# Patient Record
Sex: Female | Born: 1975 | Hispanic: Yes | Marital: Married | Smoking: Never smoker
Health system: Southern US, Community
[De-identification: ages and names within clinical notes are randomized; demographics above are authoritative.]

---

## 2007-02-20 ENCOUNTER — Encounter: Payer: Self-pay | Admitting: Maternal & Fetal Medicine

## 2007-07-11 ENCOUNTER — Ambulatory Visit: Payer: Self-pay | Admitting: Obstetrics and Gynecology

## 2007-07-14 ENCOUNTER — Inpatient Hospital Stay: Payer: Self-pay | Admitting: Obstetrics and Gynecology

## 2011-07-31 ENCOUNTER — Ambulatory Visit: Payer: Self-pay

## 2011-08-07 ENCOUNTER — Ambulatory Visit: Payer: Self-pay

## 2012-01-30 HISTORY — PX: BREAST BIOPSY: SHX20

## 2012-02-14 ENCOUNTER — Ambulatory Visit: Payer: Self-pay | Admitting: Surgery

## 2012-03-03 ENCOUNTER — Ambulatory Visit: Payer: Self-pay | Admitting: Surgery

## 2012-03-04 LAB — PATHOLOGY REPORT

## 2012-08-08 ENCOUNTER — Ambulatory Visit: Payer: Self-pay

## 2013-11-19 ENCOUNTER — Ambulatory Visit: Payer: Self-pay

## 2014-10-22 ENCOUNTER — Other Ambulatory Visit: Payer: Self-pay | Admitting: Obstetrics and Gynecology

## 2014-10-22 DIAGNOSIS — Z1231 Encounter for screening mammogram for malignant neoplasm of breast: Secondary | ICD-10-CM

## 2014-11-22 ENCOUNTER — Ambulatory Visit
Admission: RE | Admit: 2014-11-22 | Discharge: 2014-11-22 | Disposition: A | Discharge status: Home / Self Care | Payer: 59 | Source: Ambulatory Visit | Attending: Obstetrics and Gynecology | Admitting: Obstetrics and Gynecology

## 2014-11-22 DIAGNOSIS — Z1231 Encounter for screening mammogram for malignant neoplasm of breast: Secondary | ICD-10-CM | POA: Diagnosis present

## 2016-01-25 ENCOUNTER — Other Ambulatory Visit: Payer: Self-pay | Admitting: Obstetrics and Gynecology

## 2016-01-25 DIAGNOSIS — Z1231 Encounter for screening mammogram for malignant neoplasm of breast: Secondary | ICD-10-CM

## 2016-02-24 ENCOUNTER — Ambulatory Visit
Admission: RE | Admit: 2016-02-24 | Discharge: 2016-02-24 | Disposition: A | Discharge status: Home / Self Care | Payer: BLUE CROSS/BLUE SHIELD | Source: Ambulatory Visit | Attending: Obstetrics and Gynecology | Admitting: Obstetrics and Gynecology

## 2016-02-24 DIAGNOSIS — Z1231 Encounter for screening mammogram for malignant neoplasm of breast: Secondary | ICD-10-CM | POA: Diagnosis not present

## 2017-02-27 DIAGNOSIS — E611 Iron deficiency: Secondary | ICD-10-CM | POA: Insufficient documentation

## 2017-02-27 DIAGNOSIS — E559 Vitamin D deficiency, unspecified: Secondary | ICD-10-CM | POA: Insufficient documentation

## 2017-05-06 ENCOUNTER — Ambulatory Visit
Admission: RE | Admit: 2017-05-06 | Discharge: 2017-05-06 | Disposition: A | Discharge status: Home / Self Care | Payer: Self-pay | Source: Ambulatory Visit | Attending: Oncology | Admitting: Oncology

## 2017-05-06 ENCOUNTER — Encounter (INDEPENDENT_AMBULATORY_CARE_PROVIDER_SITE_OTHER): Payer: Self-pay

## 2017-05-06 ENCOUNTER — Ambulatory Visit: Payer: Self-pay | Attending: Oncology | Admitting: *Deleted

## 2017-05-06 VITALS — BP 104/71 | HR 72 | Temp 98.8°F | Ht 62.0 in | Wt 142.0 lb

## 2017-05-06 DIAGNOSIS — Z0000 Encounter for general adult medical examination without abnormal findings: Secondary | ICD-10-CM

## 2017-05-06 NOTE — Progress Notes (Signed)
Subjective:     Patient ID: Timmothy Soursvonne Clauss, female   DOB: 04-20-1975, 42 y.o.   MRN: [id number]  HPI   Review of Systems     Objective:   Physical Exam  Pulmonary/Chest: Right breast exhibits no inverted nipple, no mass, no nipple discharge, no skin change and no tenderness. Left breast exhibits no inverted nipple, no mass, no nipple discharge, no skin change and no tenderness. Breasts are symmetrical.         Assessment:     42 year old English speaking Hispanic [facility].  Last pap was completed on  01/25/16 per notes in Epic and was normal.  Next pap due in 2020.  Clinical breast exam difficult due to scattered fibroglandular like tissue.  There is no dominant mass, skin changes, nipple discharge or lymphadenopathy.  Taught self breast awareness. Patient has been screened for eligibility.  She does not have any insurance, Medicare or Medicaid.  She also meets financial eligibility.  Hand-out given on the Affordable Care Act.    Plan:     Screening mammogram ordered.  Will follow-up per [facility] protocol.

## 2017-05-06 NOTE — Patient Instructions (Signed)
Gave patient hand-out, Women Staying Healthy, Active and Well from [facility], with education on breast health, pap smears, heart and colon health. 

## 2017-05-08 ENCOUNTER — Encounter: Payer: Self-pay | Admitting: *Deleted

## 2017-05-08 NOTE — Progress Notes (Signed)
Letter mailed from the [facility] to inform patient of her normal mammogram results.  Patient is to follow-up with annual screening in one year.  HSIS to Christy. 

## 2018-06-16 ENCOUNTER — Ambulatory Visit: Payer: Self-pay

## 2018-07-31 ENCOUNTER — Telehealth: Payer: Self-pay | Admitting: *Deleted

## 2018-07-31 NOTE — Telephone Encounter (Signed)
Referred for covid19 testing by [place] health department due to possible exposure on 6/23 (last contact with that person). Denies all symptoms at this time. Discussed dates and discontinuing quarantine. Remaining free of symptoms for these last 3 days/no tylenol/advil to mask fever. Continue to monitor for symptoms for now. Afterwards, wear mask frequent handwashing and social distancing.

## 2018-08-05 ENCOUNTER — Other Ambulatory Visit: Payer: Self-pay

## 2018-08-05 DIAGNOSIS — Z20822 Contact with and (suspected) exposure to covid-19: Secondary | ICD-10-CM

## 2018-08-10 LAB — NOVEL CORONAVIRUS, NAA: SARS-CoV-2, NAA: NOT DETECTED

## 2019-03-11 ENCOUNTER — Encounter: Payer: Self-pay | Admitting: *Deleted

## 2019-03-11 ENCOUNTER — Ambulatory Visit
Admission: RE | Admit: 2019-03-11 | Discharge: 2019-03-11 | Disposition: A | Discharge status: Home / Self Care | Payer: Self-pay | Source: Ambulatory Visit | Attending: Oncology | Admitting: Oncology

## 2019-03-11 ENCOUNTER — Ambulatory Visit: Payer: Self-pay | Attending: Oncology | Admitting: *Deleted

## 2019-03-11 ENCOUNTER — Other Ambulatory Visit: Payer: Self-pay

## 2019-03-11 VITALS — BP 123/74 | HR 85 | Temp 97.5°F | Resp 18 | Ht 63.0 in | Wt 150.0 lb

## 2019-03-11 DIAGNOSIS — Z0000 Encounter for general adult medical examination without abnormal findings: Secondary | ICD-10-CM

## 2019-03-11 NOTE — Progress Notes (Signed)
  Subjective:     Patient ID: Tanya Buchanan, female   DOB: [birth year], 44 y.o.   MRN: [id number]  HPI   Review of Systems     Objective:   Physical Exam Chest:     Breasts:        Right: No swelling, bleeding, inverted nipple, mass, nipple discharge, skin change or tenderness.        Left: No swelling, bleeding, inverted nipple, mass, nipple discharge, skin change or tenderness.    Lymphadenopathy:     Upper Body:     Right upper body: No supraclavicular or axillary adenopathy.     Left upper body: No supraclavicular or axillary adenopathy.        Assessment:     44 year old English speaking Hispanic female returns to [facility] for annual screening.  Clinical breast exam unremarkable.  Taught self breast awareness.  Last pap at [facility] in January, 2021.  Next pap due per [facility] guidelines.  Patient has been screened for eligibility.  She does not have any insurance, Medicare or Medicaid.  She also meets financial eligibility.   Risk Assessment    Risk Scores      03/11/2019   Last edited by: Lynden Oxford, NT   5-year risk: 0.8 %   Lifetime risk: 8.4 %            Plan:     Screening mammogram ordered.  Will follow up per [facility] guidelines.

## 2019-03-20 ENCOUNTER — Encounter: Payer: Self-pay | Admitting: *Deleted

## 2019-03-20 NOTE — Progress Notes (Signed)
Letter mailed from [facility] to inform patient of her normal mammogram results.  Patient is to follow-up with annual screening in one year. 

## 2020-03-15 ENCOUNTER — Ambulatory Visit
Admission: RE | Admit: 2020-03-15 | Discharge: 2020-03-15 | Disposition: A | Discharge status: Home / Self Care | Payer: Self-pay | Source: Ambulatory Visit | Attending: Oncology | Admitting: Oncology

## 2020-03-15 ENCOUNTER — Encounter: Payer: Self-pay | Admitting: *Deleted

## 2020-03-15 ENCOUNTER — Ambulatory Visit: Payer: Self-pay | Attending: Oncology | Admitting: *Deleted

## 2020-03-15 ENCOUNTER — Other Ambulatory Visit: Payer: Self-pay

## 2020-03-15 VITALS — BP 111/68 | HR 73 | Temp 96.9°F | Ht 62.0 in | Wt 137.0 lb

## 2020-03-15 DIAGNOSIS — Z0000 Encounter for general adult medical examination without abnormal findings: Secondary | ICD-10-CM | POA: Insufficient documentation

## 2020-03-15 NOTE — Patient Instructions (Signed)
Gave patient hand-out, Women Staying Healthy, Active and Well from [facility], with education on breast health, pap smears, heart and colon health. 

## 2020-03-15 NOTE — Progress Notes (Signed)
  Subjective:     Patient ID: Tanya Buchanan, female   DOB: Jan 05, 1976, 45 y.o.   MRN: [id number]  HPI   [facility] Medical History Record - 03/15/20 1191      Breast History   Screening cycle New    Provider (CBE) June Leap    Initial Mammogram 03/15/20    Last Mammogram Annual    Last Mammogram Date 03/11/19    Provider (Mammogram)  Delford Field    Recent Breast Symptoms None      Breast Cancer History   Breast Cancer History No personal or family history      Previous History of Breast Problems   Breast Surgery or Biopsy Right   2014 benign; Dr. Katrinka Blazing   Breast Implants N/A    BSE Done Monthly      Gynecological/Obstetrical History   LMP 03/15/20    Is there any chance that the client could be pregnant?  No    Age at menarche 16    Age at menopause n/a    PAP smear history Annually    Date of last PAP  02/11/19    Provider (PAP) Gavin Potters    Age at first live birth 56    Breast fed children Yes (type length in comments)   6 months   DES Exposure No    Cervical, Uterine or Ovarian cancer No    Family history of Cervial, Uterine or Ovarian cancer No    Hysterectomy No    Cervix removed No    Ovaries removed No    Laser/Cryosurgery No    Current method of birth control None    Current method of Estrogen/Hormone replacement None    Smoking history None            Review of Systems     Objective:   Physical Exam Chest:  Breasts:     Right: No swelling, bleeding, inverted nipple, mass, nipple discharge, skin change, tenderness, axillary adenopathy or supraclavicular adenopathy.     Left: No swelling, bleeding, inverted nipple, mass, nipple discharge, skin change, tenderness, axillary adenopathy or supraclavicular adenopathy.     Lymphadenopathy:     Upper Body:     Right upper body: No supraclavicular or axillary adenopathy.     Left upper body: No supraclavicular or axillary adenopathy.        Assessment:     45 year old Hispanic female returns to  [facility] for annual screening.  Anne Ng, the interpreter present during the interview and exam.  Taught self breast awareness.  Last pap was on 1.13.21 and was negative / negative.  Next pap due in 4 years.  Patient has been screened for eligibility.  She does not have any insurance, Medicare or Medicaid.  She also meets financial eligibility.   Risk Assessment    Risk Scores      03/15/2020 03/11/2019   Last edited by: Scarlett Presto, RN Starcher, Burgess Amor, NT   5-year risk: 0.9 % 0.8 %   Lifetime risk: 8.3 % 8.4 %            Plan:     Screening mammogram ordered.  Will follow up per [facility] protocol.

## 2020-03-18 ENCOUNTER — Encounter: Payer: Self-pay | Admitting: *Deleted

## 2020-03-18 NOTE — Progress Notes (Signed)
Letter mailed from [facility] to inform patient of her normal mammogram results.  Patient is to follow-up with annual screening in one year. 

## 2020-09-13 DIAGNOSIS — E221 Hyperprolactinemia: Secondary | ICD-10-CM | POA: Insufficient documentation

## 2021-03-21 ENCOUNTER — Other Ambulatory Visit: Payer: Self-pay | Admitting: Internal Medicine

## 2021-03-21 DIAGNOSIS — E221 Hyperprolactinemia: Secondary | ICD-10-CM

## 2021-04-05 ENCOUNTER — Other Ambulatory Visit: Payer: Self-pay

## 2021-04-05 DIAGNOSIS — Z1231 Encounter for screening mammogram for malignant neoplasm of breast: Secondary | ICD-10-CM

## 2021-05-02 ENCOUNTER — Ambulatory Visit: Payer: Self-pay

## 2021-05-03 ENCOUNTER — Other Ambulatory Visit: Payer: Self-pay

## 2021-05-03 DIAGNOSIS — Z1211 Encounter for screening for malignant neoplasm of colon: Secondary | ICD-10-CM

## 2021-05-09 ENCOUNTER — Ambulatory Visit
Admission: RE | Admit: 2021-05-09 | Discharge: 2021-05-09 | Disposition: A | Discharge status: Home / Self Care | Payer: Self-pay | Source: Ambulatory Visit | Attending: Obstetrics and Gynecology | Admitting: Obstetrics and Gynecology

## 2021-05-09 ENCOUNTER — Ambulatory Visit: Payer: Self-pay | Attending: Hematology and Oncology | Admitting: *Deleted

## 2021-05-09 VITALS — BP 115/69 | HR 66 | Resp 18 | Wt 138.0 lb

## 2021-05-09 DIAGNOSIS — Z1231 Encounter for screening mammogram for malignant neoplasm of breast: Secondary | ICD-10-CM | POA: Insufficient documentation

## 2021-05-09 DIAGNOSIS — Z1239 Encounter for other screening for malignant neoplasm of breast: Secondary | ICD-10-CM

## 2021-05-09 NOTE — Patient Instructions (Signed)
Explained breast self awareness with [name]. Patient did not need a Pap smear today due to last Pap smear and HPV typing was 02/11/2019. Let her know [facility] will cover Pap smears and HPV typing every 5 years unless has a history of abnormal Pap smears. Referred patient to the [facility] for a screening mammogram. Appointment scheduled Tuesday, May 09, 2021 at 1040. Patient aware of appointment and will be there. Let patient know Delford Field will follow up with her within the next couple weeks with results of mammogram by letter or phone. [name] verbalized understanding. ? ?Clark Cuff, Kathaleen Maser, RN ?9:43 AM ? ? ? ? ?

## 2021-05-09 NOTE — Progress Notes (Signed)
[name] is a 46 y.o. female who presents to [facility] clinic today with no complaints.  ?  ?Pap Smear: Pap smear not completed today. Last Pap smear was 02/11/2019 at [facility] clinic and was normal with negative HPV. Per patient has no history of an abnormal Pap smear. Last Pap smear result is available in Epic. ?  ?Physical exam: ?Breasts ?Breasts symmetrical. No skin abnormalities bilateral breasts. No nipple retraction bilateral breasts. No nipple discharge bilateral breasts. No lymphadenopathy. No lumps palpated bilateral breasts. No complaints of pain or tenderness on exam. ? ?MS DIGITAL SCREENING TOMO BILATERAL ? ?Result Date: 03/18/2020 ?CLINICAL DATA:  Screening. EXAM: DIGITAL SCREENING BILATERAL MAMMOGRAM WITH TOMOSYNTHESIS AND CAD TECHNIQUE: Bilateral screening digital craniocaudal and mediolateral oblique mammograms were obtained. Bilateral screening digital breast tomosynthesis was performed. The images were evaluated with computer-aided detection. COMPARISON:  Previous exam(s). ACR Breast Density Category c: The breast tissue is heterogeneously dense, which may obscure small masses. FINDINGS: There are no findings suspicious for malignancy. IMPRESSION: No mammographic evidence of malignancy. A result letter of this screening mammogram will be mailed directly to the patient. RECOMMENDATION: Screening mammogram in one year. (Code:SM-B-01Y) BI-RADS CATEGORY  1: Negative. Electronically Signed   By: Tanya Buchanan M.D.   On: 03/18/2020 08:04  ? ?MS DIGITAL SCREENING TOMO BILATERAL ? ?Result Date: 03/12/2019 ?CLINICAL DATA:  Screening. EXAM: DIGITAL SCREENING BILATERAL MAMMOGRAM WITH TOMO AND CAD COMPARISON:  Previous exam(s). ACR Breast Density Category c: The breast tissue is heterogeneously dense, which may obscure small masses. FINDINGS: There are no findings suspicious for malignancy. Images were processed with CAD. IMPRESSION: No mammographic evidence of malignancy. A result letter of  this screening mammogram will be mailed directly to the patient. RECOMMENDATION: Screening mammogram in one year. (Code:SM-B-01Y) BI-RADS CATEGORY  1: Negative. Electronically Signed   By: Tanya Buchanan M.D.   On: 03/12/2019 09:46  ? ?MS DIGITAL SCREENING TOMO BILATERAL ? ?Result Date: 05/06/2017 ?CLINICAL DATA:  Screening. EXAM: DIGITAL SCREENING BILATERAL MAMMOGRAM WITH TOMO AND CAD COMPARISON:  Previous exam(s). ACR Breast Density Category c: The breast tissue is heterogeneously dense, which may obscure small masses. FINDINGS: There are no findings suspicious for malignancy. Images were processed with CAD. IMPRESSION: No mammographic evidence of malignancy. A result letter of this screening mammogram will be mailed directly to the patient. RECOMMENDATION: Screening mammogram in one year. (Code:SM-B-01Y) BI-RADS CATEGORY  1: Negative. Electronically Signed   By: Tanya Buchanan M.D.   On: 05/06/2017 14:02   ?     ?Pelvic/Bimanual ?Pap is not indicated today per [facility] guidelines. ?  ?Smoking History: ?Patient has never smoked. ?  ?Patient Navigation: ?Patient education provided. Access to services provided for patient through [employer] program. Spanish interpreter Tanya Buchanan from [facility] provided.  ? ?Colorectal Cancer Screening: ?Per patient has never had colonoscopy completed. FIT Test given to patient to complete. No complaints today.  ?  ?Breast and Cervical Cancer Risk Assessment: ?Patient does not have family history of breast cancer, known genetic mutations, or radiation treatment to the chest before age 83. Patient does not have history of cervical dysplasia, immunocompromised, or DES exposure in-utero. ? ?Risk Assessment   ? ? Risk Scores   ? ?   05/09/2021 03/15/2020  ? Last edited by: Tanya Chris, RN Tanya Presto, RN  ? 5-year risk: 0.9 % 0.9 %  ? Lifetime risk: 8.2 % 8.3 %  ? ?  ?  ? ?  ? ? ?A: ?[facility] exam without  pap smear ?No complaints. ? ?P: ?Referred patient to the [facility] for a screening mammogram. Appointment scheduled Tuesday, May 09, 2021 at 1040. ? ?Tanya Heidelberg, RN ?05/09/2021 9:43 AM   ?

## 2021-05-10 ENCOUNTER — Other Ambulatory Visit: Payer: Self-pay | Admitting: Obstetrics and Gynecology

## 2021-05-18 LAB — FECAL OCCULT BLOOD, IMMUNOCHEMICAL: Fecal Occult Bld: NEGATIVE

## 2022-05-22 ENCOUNTER — Other Ambulatory Visit: Payer: Self-pay

## 2022-05-22 DIAGNOSIS — Z1231 Encounter for screening mammogram for malignant neoplasm of breast: Secondary | ICD-10-CM

## 2022-06-11 ENCOUNTER — Ambulatory Visit
Admission: RE | Admit: 2022-06-11 | Discharge: 2022-06-11 | Disposition: A | Discharge status: Home / Self Care | Payer: Self-pay | Source: Ambulatory Visit | Attending: Obstetrics and Gynecology | Admitting: Obstetrics and Gynecology

## 2022-06-11 ENCOUNTER — Ambulatory Visit: Payer: Self-pay | Attending: Hematology and Oncology | Admitting: Hematology and Oncology

## 2022-06-11 VITALS — Wt 138.8 lb

## 2022-06-11 DIAGNOSIS — Z1231 Encounter for screening mammogram for malignant neoplasm of breast: Secondary | ICD-10-CM

## 2022-06-11 NOTE — Progress Notes (Signed)
[name] is a 47 y.o. female who presents to [facility] clinic today with no complaints.    Pap Smear: Pap not smear completed today. Last Pap smear was 02/11/2019 at [facility] clinic and was normal. Per patient has no history of an abnormal Pap smear. Last Pap smear result is available in Epic.   Physical exam: Breasts Breasts symmetrical. No skin abnormalities bilateral breasts. No nipple retraction bilateral breasts. No nipple discharge bilateral breasts. No lymphadenopathy. No lumps palpated bilateral breasts.  MS DIGITAL SCREENING TOMO BILATERAL  Result Date: 05/09/2021 CLINICAL DATA:  Screening. EXAM: DIGITAL SCREENING BILATERAL MAMMOGRAM WITH TOMOSYNTHESIS AND CAD TECHNIQUE: Bilateral screening digital craniocaudal and mediolateral oblique mammograms were obtained. Bilateral screening digital breast tomosynthesis was performed. The images were evaluated with computer-aided detection. COMPARISON:  Previous exam(s). ACR Breast Density Category c: The breast tissue is heterogeneously dense, which may obscure small masses. FINDINGS: There are no findings suspicious for malignancy. IMPRESSION: No mammographic evidence of malignancy. A result letter of this screening mammogram will be mailed directly to the patient. RECOMMENDATION: Screening mammogram in one year. (Code:SM-B-01Y) BI-RADS CATEGORY  1: Negative. Electronically Signed   By: Ted Mcalpine M.D.   On: 05/09/2021 17:22   MS DIGITAL SCREENING TOMO BILATERAL  Result Date: 03/18/2020 CLINICAL DATA:  Screening. EXAM: DIGITAL SCREENING BILATERAL MAMMOGRAM WITH TOMOSYNTHESIS AND CAD TECHNIQUE: Bilateral screening digital craniocaudal and mediolateral oblique mammograms were obtained. Bilateral screening digital breast tomosynthesis was performed. The images were evaluated with computer-aided detection. COMPARISON:  Previous exam(s). ACR Breast Density Category c: The breast tissue is heterogeneously dense, which may obscure small masses.  FINDINGS: There are no findings suspicious for malignancy. IMPRESSION: No mammographic evidence of malignancy. A result letter of this screening mammogram will be mailed directly to the patient. RECOMMENDATION: Screening mammogram in one year. (Code:SM-B-01Y) BI-RADS CATEGORY  1: Negative. Electronically Signed   By: Emmaline Kluver M.D.   On: 03/18/2020 08:04   MS DIGITAL SCREENING TOMO BILATERAL  Result Date: 03/12/2019 CLINICAL DATA:  Screening. EXAM: DIGITAL SCREENING BILATERAL MAMMOGRAM WITH TOMO AND CAD COMPARISON:  Previous exam(s). ACR Breast Density Category c: The breast tissue is heterogeneously dense, which may obscure small masses. FINDINGS: There are no findings suspicious for malignancy. Images were processed with CAD. IMPRESSION: No mammographic evidence of malignancy. A result letter of this screening mammogram will be mailed directly to the patient. RECOMMENDATION: Screening mammogram in one year. (Code:SM-B-01Y) BI-RADS CATEGORY  1: Negative. Electronically Signed   By: Frederico Hamman M.D.   On: 03/12/2019 09:46         Pelvic/Bimanual Pap is not indicated today    Smoking History: Patient has never smoked and was not referred to quit line.    Patient Navigation: Patient education provided. Access to services provided for patient through [facility] program. No interpreter provided. No transportation provided   Colorectal Cancer Screening: Per patient has never had colonoscopy completed No complaints today. FIT test completed 05/14/2022   Breast and Cervical Cancer Risk Assessment: Patient does not have family history of breast cancer, known genetic mutations, or radiation treatment to the chest before age 66. Patient does not have history of cervical dysplasia, immunocompromised, or DES exposure in-utero.  Risk Scores as of 06/11/2022     Tanya Buchanan           5-year 1.25 %   Lifetime 11.25 %   This patient is Hisp[place]/Latina but has no documented birth country, so the [employer]  model used data from [place] patients to  calculate their risk score. Document a birth country in the Demographics activity for a more accurate score.         Last calculated by Caprice Red, CMA on 06/11/2022 at  1:39 PM        A: [facility] exam without pap smear No complaints with benign exam.   P: [facility]. Appointment scheduled 06/11/22.  Pascal Lux, NP 06/11/2022 1:55 PM

## 2022-06-11 NOTE — Patient Instructions (Signed)
Taught Tanya Buchanan about self breast awareness and gave educational materials to take home. Patient did not need a Pap smear today due to last Pap smear was in 02/11/2019 per patient. Let her know [facility] will cover Pap smears every 5 years unless has a history of abnormal Pap smears. [facility]. Appointment scheduled for 06/11/22. Patient aware of appointment and will be there. Let patient know will follow up with her within the next couple weeks with results. Tanya Buchanan verbalized understanding.  Pascal Lux, NP 1:57 PM

## 2023-01-19 IMAGING — MG MM DIGITAL SCREENING BILAT W/ TOMO AND CAD
8 series · 9 of 24 positions shown · non-contrast
Comparison: Previous exam(s).

CLINICAL DATA: Screening.

EXAM:
DIGITAL SCREENING BILATERAL MAMMOGRAM WITH TOMOSYNTHESIS AND CAD
TECHNIQUE: Bilateral screening digital craniocaudal and mediolateral oblique
mammograms were obtained. Bilateral screening digital breast
tomosynthesis was performed. The images were evaluated with
computer-aided detection.

[L CC synth-2D]
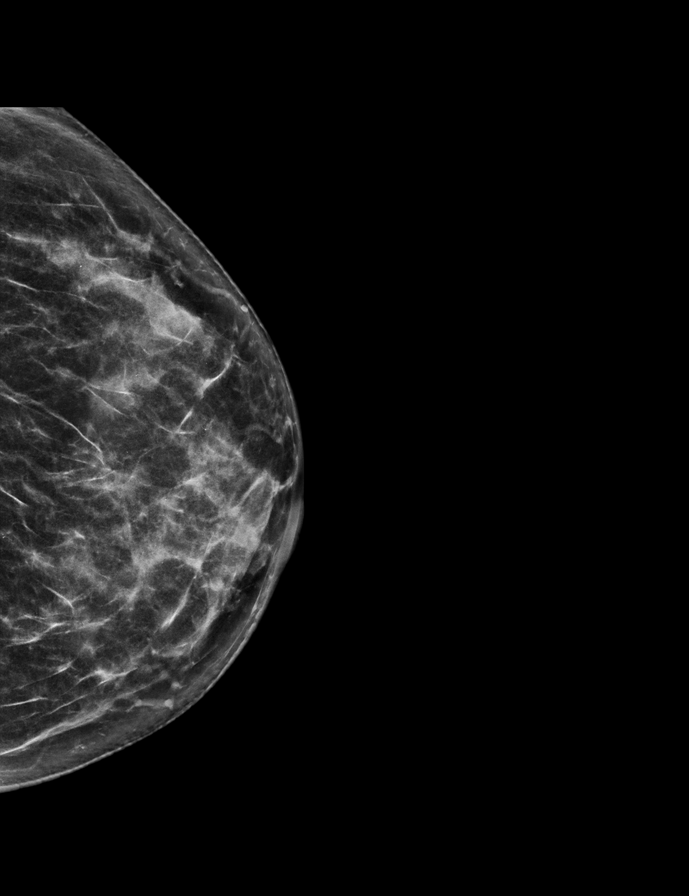

[L MLO synth-2D]
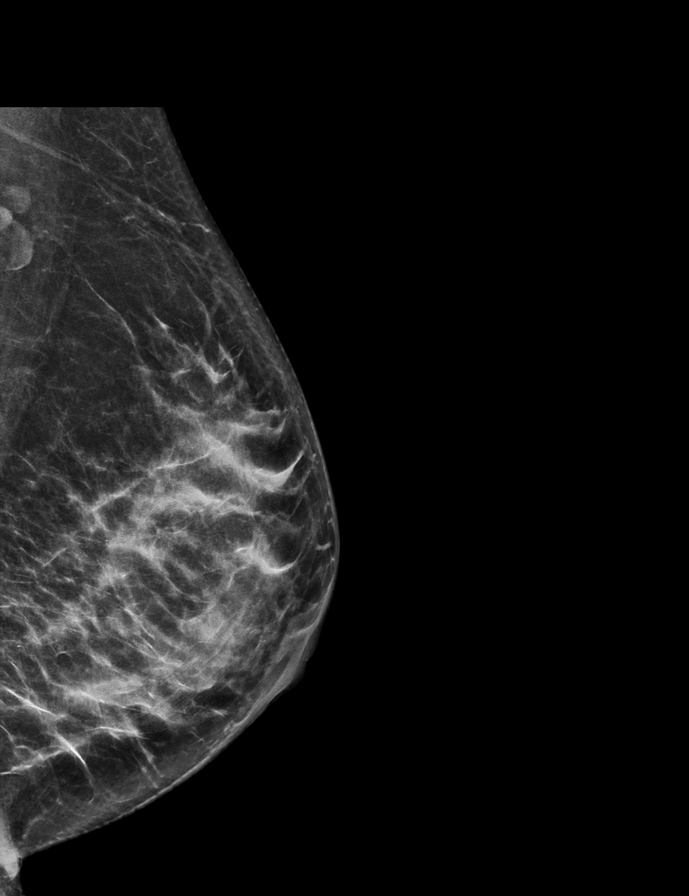

[R CC synth-2D]
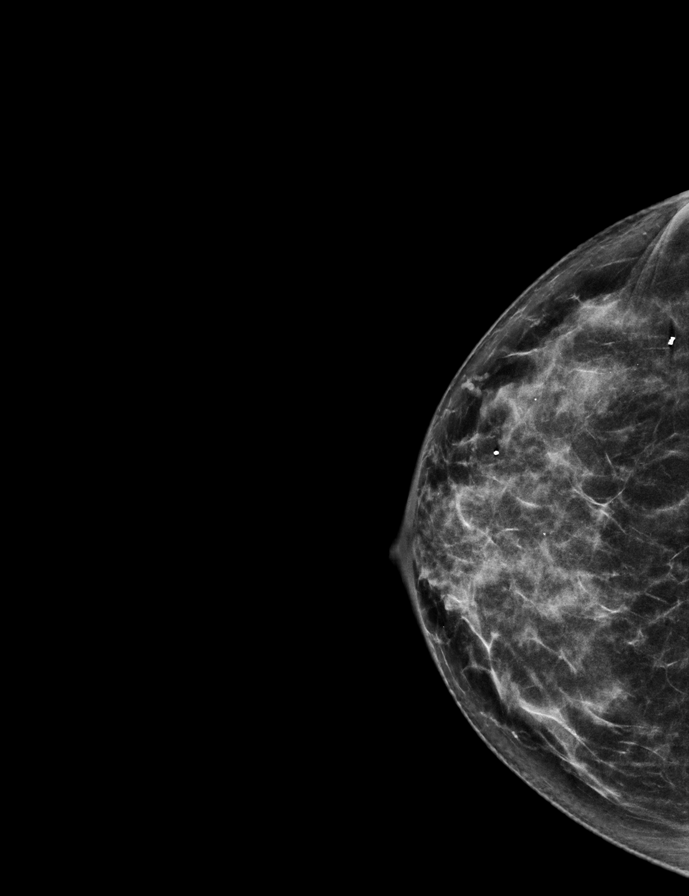

[R MLO synth-2D]
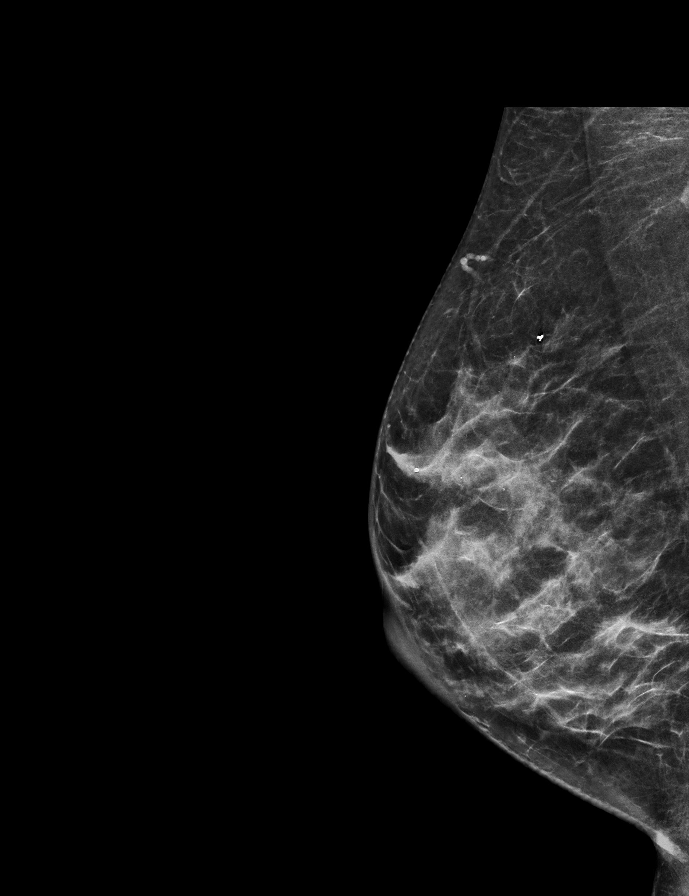

[L MLO tomo · 2 of 63 frames shown]
[frame 21/63]
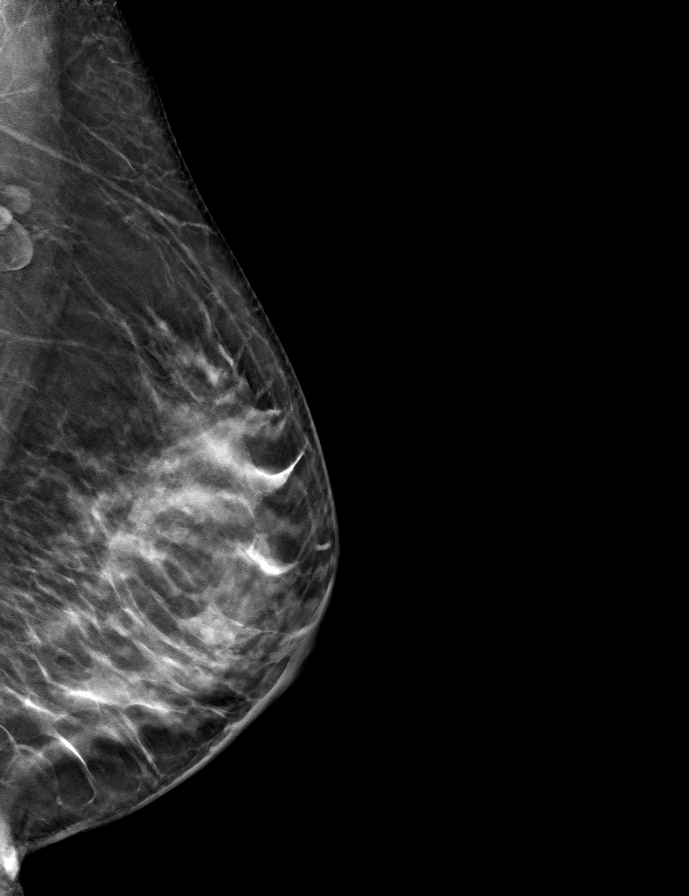
[frame 32/63]
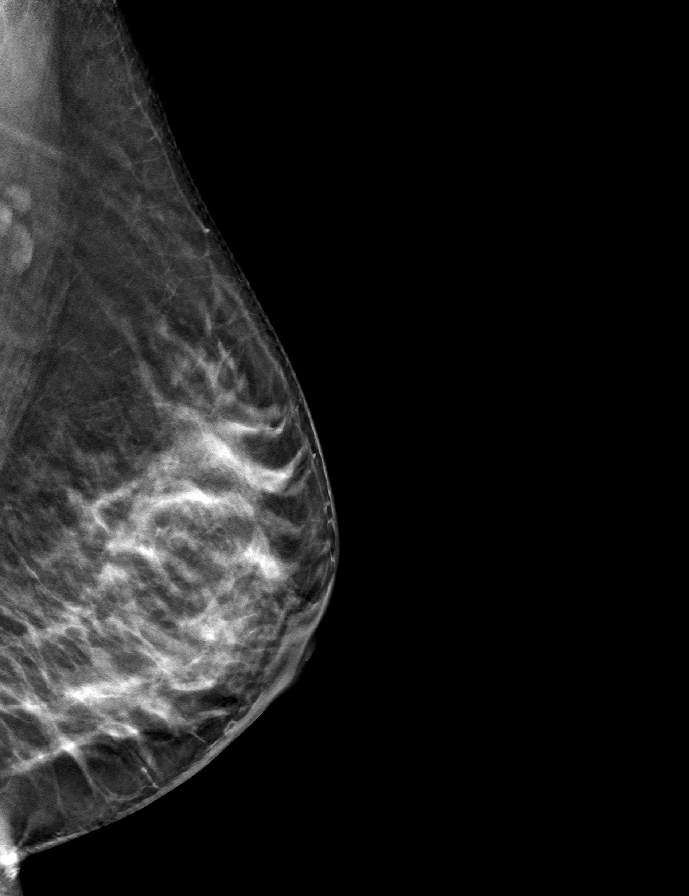

[R CC tomo · tomo slice 31/60.0]
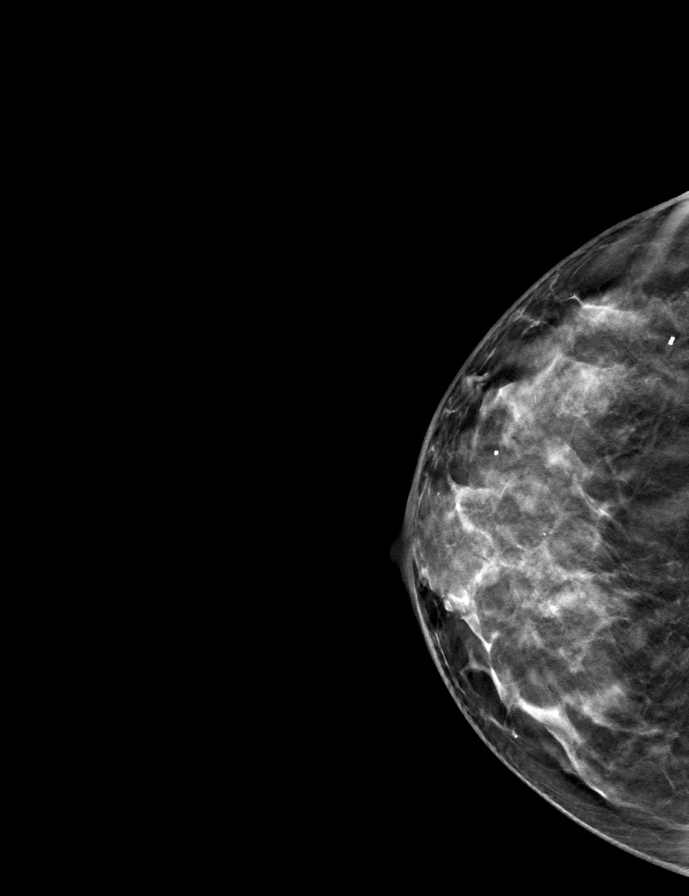

[R MLO tomo · tomo slice 31/60.0]
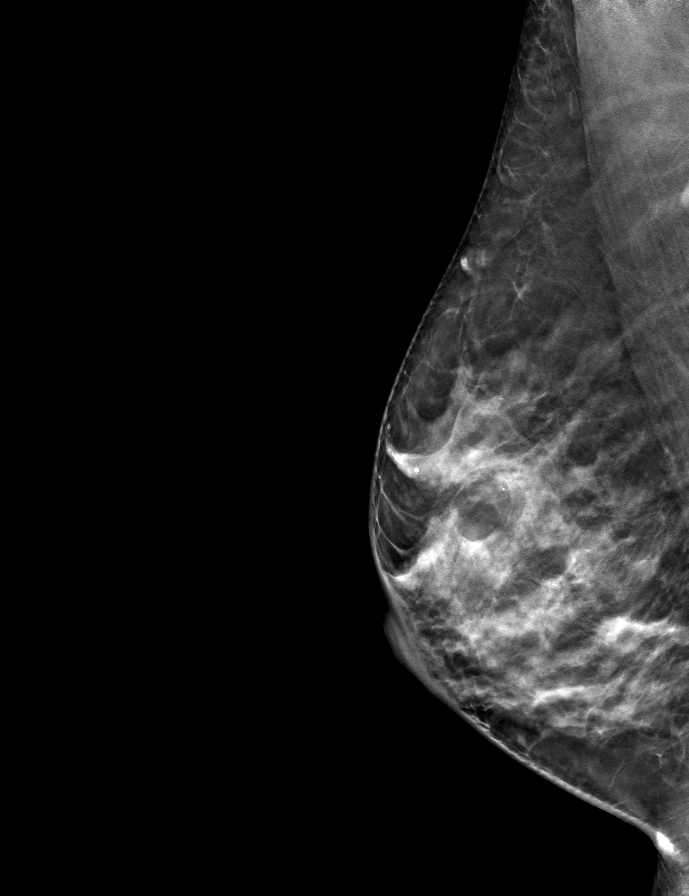

[L CC tomo · tomo slice 31/61.0]
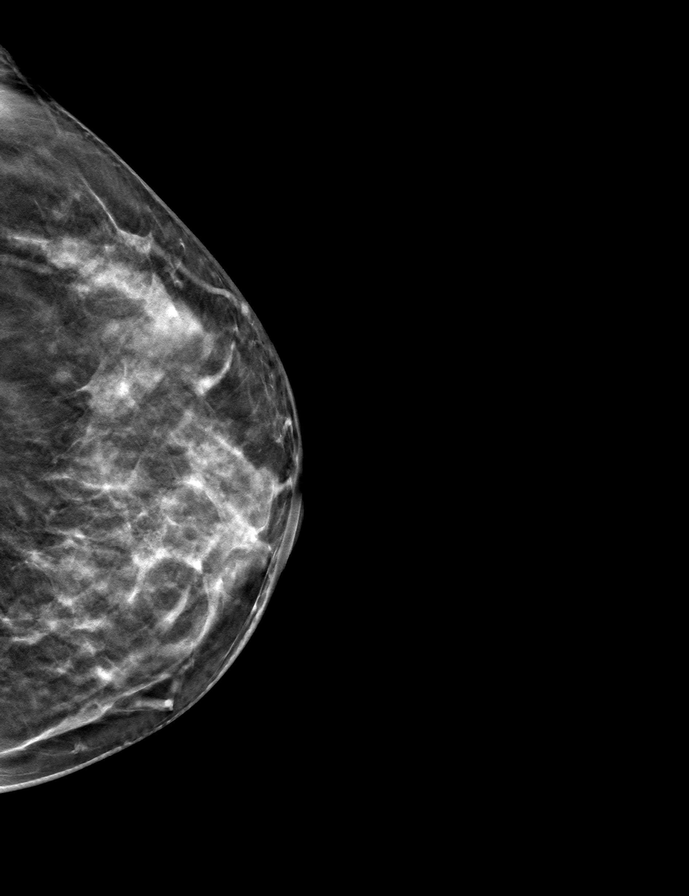

[9 of 24 positions shown; findings below may reference images not displayed]

ACR Breast Density Category c: The breast tissue is heterogeneously
dense, which may obscure small masses.
FINDINGS: There are no findings suspicious for malignancy.
IMPRESSION: No mammographic evidence of malignancy. A result letter of this
screening mammogram will be mailed directly to the patient.

RECOMMENDATION:
Screening mammogram in one year. (Code:Q3-W-BC3)

BI-RADS CATEGORY  1: Negative.

## 2023-05-14 ENCOUNTER — Other Ambulatory Visit: Payer: Self-pay | Admitting: Certified Nurse Midwife

## 2023-05-14 DIAGNOSIS — Z1231 Encounter for screening mammogram for malignant neoplasm of breast: Secondary | ICD-10-CM

## 2023-05-15 ENCOUNTER — Telehealth: Payer: Self-pay | Admitting: *Deleted

## 2023-06-12 ENCOUNTER — Ambulatory Visit
Admission: RE | Admit: 2023-06-12 | Discharge: 2023-06-12 | Disposition: A | Discharge status: Home / Self Care | Payer: Self-pay | Source: Ambulatory Visit | Attending: Certified Nurse Midwife | Admitting: Certified Nurse Midwife

## 2023-06-12 DIAGNOSIS — Z1231 Encounter for screening mammogram for malignant neoplasm of breast: Secondary | ICD-10-CM | POA: Insufficient documentation
